# Patient Record
Sex: Female | Born: 1985 | Hispanic: No | Marital: Married | Smoking: Never smoker
Health system: Southern US, Community
[De-identification: ages and names within clinical notes are randomized; demographics above are authoritative.]

## PROBLEM LIST (undated history)

## (undated) DIAGNOSIS — G43909 Migraine, unspecified, not intractable, without status migrainosus: Secondary | ICD-10-CM

---
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
## 2004-10-21 ENCOUNTER — Other Ambulatory Visit: Admission: RE | Admit: 2004-10-21 | Discharge: 2004-10-21 | Payer: Self-pay | Admitting: Gynecology

## 2006-01-20 ENCOUNTER — Other Ambulatory Visit: Admission: RE | Admit: 2006-01-20 | Discharge: 2006-01-20 | Payer: Self-pay | Admitting: Obstetrics and Gynecology

## 2006-06-12 ENCOUNTER — Other Ambulatory Visit: Admission: RE | Admit: 2006-06-12 | Discharge: 2006-06-12 | Payer: Self-pay | Admitting: Gynecology

## 2020-10-10 ENCOUNTER — Encounter: Payer: Self-pay | Admitting: Emergency Medicine

## 2020-10-10 ENCOUNTER — Emergency Department

## 2020-10-10 ENCOUNTER — Emergency Department
Admission: EM | Admit: 2020-10-10 | Discharge: 2020-10-10 | Disposition: A | Discharge status: Home / Self Care | Attending: Emergency Medicine | Admitting: Emergency Medicine

## 2020-10-10 ENCOUNTER — Other Ambulatory Visit: Payer: Self-pay

## 2020-10-10 DIAGNOSIS — Y92009 Unspecified place in unspecified non-institutional (private) residence as the place of occurrence of the external cause: Secondary | ICD-10-CM | POA: Insufficient documentation

## 2020-10-10 DIAGNOSIS — S5011XA Contusion of right forearm, initial encounter: Secondary | ICD-10-CM | POA: Insufficient documentation

## 2020-10-10 DIAGNOSIS — S4991XA Unspecified injury of right shoulder and upper arm, initial encounter: Secondary | ICD-10-CM | POA: Diagnosis present

## 2020-10-10 DIAGNOSIS — S46911A Strain of unspecified muscle, fascia and tendon at shoulder and upper arm level, right arm, initial encounter: Secondary | ICD-10-CM | POA: Insufficient documentation

## 2020-10-10 DIAGNOSIS — W19XXXA Unspecified fall, initial encounter: Secondary | ICD-10-CM | POA: Diagnosis not present

## 2020-10-10 HISTORY — DX: Migraine, unspecified, not intractable, without status migrainosus: G43.909

## 2020-10-10 MED ORDER — IBUPROFEN 800 MG PO TABS
800.0000 mg | ORAL_TABLET | Freq: Once | ORAL | Status: AC
  Administered 2020-10-10: 800 mg via ORAL
  Filled 2020-10-10: qty 1

## 2020-10-10 MED ORDER — MELOXICAM 15 MG PO TABS: 15.0000 mg | ORAL_TABLET | Freq: Every day | ORAL | 0 refills | Status: DC

## 2020-10-10 NOTE — Discharge Instructions (Addendum)
Please wear sling as needed for comfort.  Take meloxicam daily for pain.  You may use Tylenol for additional pain relief.  Avoid taking any other NSAIDs such as Aleve or ibuprofen while you are on the meloxicam.  Apply ice to the wrist and shoulder 20 minutes every hour for the next 2 days.  If any continued pain after 1 week, follow-up with primary care provider.

## 2020-10-10 NOTE — ED Provider Notes (Signed)
[facility] EMERGENCY DEPARTMENT Provider Note   CSN: [id number] Arrival date & time: 10/10/20  1723     History Chief Complaint  Patient presents with  . Arm Injury    Pamela Hester is a 35 y.o. female presents to the emergency department for evaluation of right shoulder and distal forearm/wrist pain.  [place], [place] on her right forearm and developed some shoulder pain.  She has full range of motion of the shoulder but has pain with abduction greater than 90 degrees.  She has good range of motion wrist with no discomfort was tender along the distal forearm.  She has no pain or discomfort along the carpals or navicular region.  No numbness or tingling.  She denies hitting her head or losing consciousness.  No other injuries to her body.  HPI     Past Medical History:  Diagnosis Date  . Migraine     There are no problems to display for this patient.      OB History   No obstetric history on file.     No family history on file.  Social History   Tobacco Use  . Smoking status: Never Smoker  . Smokeless tobacco: Never Used  Vaping Use  . Vaping Use: Never used  Substance Use Topics  . Alcohol use: Yes  . Drug use: Never    Home Medications Prior to Admission medications   Medication Sig Start Date End Date Taking? Authorizing Provider  meloxicam (MOBIC) 15 MG tablet Take 1 tablet (15 mg total) by mouth daily. 10/10/20 10/10/21 Yes Evon Slack, PA-C    Allergies    Patient has no known allergies.  Review of Systems   Review of Systems  Constitutional: Negative for activity change.  Eyes: Negative for pain and visual disturbance.  Respiratory: Negative for shortness of breath.   Cardiovascular: Negative for chest pain and leg swelling.  Gastrointestinal: Negative for abdominal pain.  Genitourinary: Negative for flank pain and pelvic pain.  Musculoskeletal: Positive for arthralgias. Negative for gait  problem, joint swelling, myalgias, neck pain and neck stiffness.  Skin: Negative for wound.  Neurological: Negative for dizziness, syncope, weakness, light-headedness, numbness and headaches.  Psychiatric/Behavioral: Negative for confusion and decreased concentration.    Physical Exam Updated Vital Signs BP (!) 132/91 (BP Location: Left Arm)   Pulse 89   Temp 98.5 F (36.9 C) (Oral)   Resp 20   Ht 5\' 3"  (1.6 m)   Wt 64.4 kg   LMP 09/18/2020   SpO2 100%   BMI 25.15 kg/m   Physical Exam Constitutional:      Appearance: She is well-developed.  HENT:     Head: Normocephalic and atraumatic.     Right Ear: External ear normal.     Left Ear: External ear normal.     Nose: Nose normal.  Eyes:     Conjunctiva/sclera: Conjunctivae normal.     Pupils: Pupils are equal, round, and reactive to light.  Cardiovascular:     Rate and Rhythm: Normal rate.  Pulmonary:     Effort: Pulmonary effort is normal. No respiratory distress.     Breath sounds: Normal breath sounds.  Abdominal:     Palpations: Abdomen is soft.     Tenderness: There is no abdominal tenderness.  Musculoskeletal:        General: Tenderness present. No deformity. Normal range of motion.     Cervical back: Normal range of motion.  Comments: Patient with positive Hawkins and impingement test.  Normal active range of motion of shoulder.  Nontender along the clavicle or cervical spine.  She has full range of motion of the elbow with no tenderness or swelling.  She has some tenderness to the distal forearm at the distal radius metadiaphysis with no wrist joint tenderness and no tenderness along the carpals or navicular bone.  Skin:    General: Skin is warm and dry.     Findings: No rash.  Neurological:     General: No focal deficit present.     Mental Status: She is alert and oriented to person, place, and time. Mental status is at baseline.     Cranial Nerves: No cranial nerve deficit.     Coordination: Coordination  normal.  Psychiatric:        Behavior: Behavior normal.        Thought Content: Thought content normal.     ED Results / Procedures / Treatments   Labs (all labs ordered are listed, but only abnormal results are displayed) Labs Reviewed - No data to display  EKG None  Radiology DG Shoulder Right  Result Date: 10/10/2020 CLINICAL DATA:  Arm injury. EXAM: RIGHT SHOULDER - 2+ VIEW COMPARISON:  None. FINDINGS: There is no evidence of fracture or dislocation. There is no evidence of arthropathy or other focal bone abnormality. Soft tissues are unremarkable. IMPRESSION: Negative. Electronically Signed   By: Ted Mcalpine M.D.   On: 10/10/2020 18:41   DG Wrist Complete Right  Result Date: 10/10/2020 CLINICAL DATA:  Arm injury with pain. EXAM: RIGHT WRIST - COMPLETE 3+ VIEW COMPARISON:  None. FINDINGS: There is no evidence of fracture or dislocation. There is no evidence of arthropathy or other focal bone abnormality. Soft tissues are unremarkable. IMPRESSION: Negative. Electronically Signed   By: Ted Mcalpine M.D.   On: 10/10/2020 18:41    Procedures Procedures   Medications Ordered in ED Medications  ibuprofen (ADVIL) tablet 800 mg (has no administration in time range)    ED Course  I have reviewed the triage vital signs and the nursing notes.  Pertinent labs & imaging results that were available during my care of the patient were reviewed by me and considered in my medical decision making (see chart for details).    MDM Rules/Calculators/A&P                          35 year old female with fall developed right forearm contusion and strain the right shoulder.  X-ray of the shoulder and wrist negative.  She will wear sling for comfort for 1 week.  She will take anti-inflammatory medication, meloxicam for 2 weeks.  She will apply ice over the next couple days and will follow-up with PCP in 1 week if no improvement.  She understands signs symptoms return to the ER  for. Final Clinical Impression(s) / ED Diagnoses Final diagnoses:  Fall, initial encounter  Shoulder strain, right, initial encounter  Contusion of right forearm, initial encounter    Rx / DC Orders ED Discharge Orders         Ordered    meloxicam (MOBIC) 15 MG tablet  Daily        10/10/20 2002           Ronnette Juniper 10/10/20 2008    Minna Antis, MD 10/11/20 0005

## 2020-10-10 NOTE — ED Triage Notes (Signed)
Pt via POV from home. [place] and fell around 4:00pm this evening. Pt c/o R shoulder and R wrist pain. Pt is A&Ox4 and NAD.

## 2021-10-09 ENCOUNTER — Other Ambulatory Visit: Payer: Self-pay

## 2021-10-09 ENCOUNTER — Inpatient Hospital Stay
Admission: EM | Admit: 2021-10-09 | Discharge: 2021-10-10 | DRG: 759 | Disposition: A | Discharge status: Home / Self Care | Attending: Obstetrics and Gynecology | Admitting: Obstetrics and Gynecology

## 2021-10-09 ENCOUNTER — Encounter: Payer: Self-pay | Admitting: Emergency Medicine

## 2021-10-09 ENCOUNTER — Emergency Department

## 2021-10-09 DIAGNOSIS — N730 Acute parametritis and pelvic cellulitis: Principal | ICD-10-CM | POA: Diagnosis present

## 2021-10-09 DIAGNOSIS — N739 Female pelvic inflammatory disease, unspecified: Principal | ICD-10-CM | POA: Diagnosis present

## 2021-10-09 DIAGNOSIS — R102 Pelvic and perineal pain: Secondary | ICD-10-CM | POA: Diagnosis present

## 2021-10-09 LAB — CBC WITH DIFFERENTIAL/PLATELET
Abs Immature Granulocytes: 0.01 10*3/uL (ref 0.00–0.07)
Basophils Absolute: 0 10*3/uL (ref 0.0–0.1)
Basophils Relative: 1 %
Eosinophils Absolute: 0 10*3/uL (ref 0.0–0.5)
Eosinophils Relative: 1 %
HCT: 37.2 % (ref 36.0–46.0)
Hemoglobin: 12 g/dL (ref 12.0–15.0)
Immature Granulocytes: 0 %
Lymphocytes Relative: 35 %
Lymphs Abs: 1.4 10*3/uL (ref 0.7–4.0)
MCH: 29.3 pg (ref 26.0–34.0)
MCHC: 32.3 g/dL (ref 30.0–36.0)
MCV: 90.7 fL (ref 80.0–100.0)
Monocytes Absolute: 0.5 10*3/uL (ref 0.1–1.0)
Monocytes Relative: 13 %
Neutro Abs: 2 10*3/uL (ref 1.7–7.7)
Neutrophils Relative %: 50 %
Platelets: 227 10*3/uL (ref 150–400)
RBC: 4.1 MIL/uL (ref 3.87–5.11)
RDW: 12 % (ref 11.5–15.5)
WBC: 4.1 10*3/uL (ref 4.0–10.5)
nRBC: 0 % (ref 0.0–0.2)

## 2021-10-09 LAB — COMPREHENSIVE METABOLIC PANEL
ALT: 19 U/L (ref 0–44)
AST: 27 U/L (ref 15–41)
Albumin: 4.4 g/dL (ref 3.5–5.0)
Alkaline Phosphatase: 35 U/L — ABNORMAL LOW (ref 38–126)
Anion gap: 8 (ref 5–15)
BUN: 8 mg/dL (ref 6–20)
CO2: 24 mmol/L (ref 22–32)
Calcium: 9.1 mg/dL (ref 8.9–10.3)
Chloride: 108 mmol/L (ref 98–111)
Creatinine, Ser: 0.74 mg/dL (ref 0.44–1.00)
GFR, Estimated: >60 mL/min (ref >60)
Glucose, Bld: 111 mg/dL — ABNORMAL HIGH (ref 70–99)
Potassium: 3.9 mmol/L (ref 3.5–5.1)
Sodium: 140 mmol/L (ref 135–145)
Total Bilirubin: 0.6 mg/dL (ref 0.3–1.2)
Total Protein: 8.1 g/dL (ref 6.5–8.1)

## 2021-10-09 LAB — URINALYSIS, ROUTINE W REFLEX MICROSCOPIC
Bilirubin Urine: NEGATIVE
Glucose, UA: NEGATIVE mg/dL
Hgb urine dipstick: NEGATIVE
Ketones, ur: NEGATIVE mg/dL
Nitrite: NEGATIVE
Protein, ur: NEGATIVE mg/dL
Specific Gravity, Urine: 1.031 — ABNORMAL HIGH (ref 1.005–1.030)
pH: 5 (ref 5.0–8.0)

## 2021-10-09 LAB — PREGNANCY, URINE: Preg Test, Ur: NEGATIVE

## 2021-10-09 LAB — WET PREP, GENITAL
Clue Cells Wet Prep HPF POC: NONE SEEN
Sperm: NONE SEEN
Trich, Wet Prep: NONE SEEN
WBC, Wet Prep HPF POC: >10 — AB (ref <10)
Yeast Wet Prep HPF POC: NONE SEEN

## 2021-10-09 LAB — LACTIC ACID, PLASMA
Lactic Acid, Venous: 2 mmol/L (ref 0.5–1.9)
Lactic Acid, Venous: 0.8 mmol/L (ref 0.5–1.9)

## 2021-10-09 MED ORDER — ACETAMINOPHEN 325 MG PO TABS: 650.0000 mg | ORAL_TABLET | ORAL | Status: DC | PRN

## 2021-10-09 MED ORDER — ONDANSETRON HCL 4 MG PO TABS: 4.0000 mg | ORAL_TABLET | Freq: Four times a day (QID) | ORAL | Status: DC | PRN

## 2021-10-09 MED ORDER — IBUPROFEN 100 MG/5ML PO SUSP
ORAL | Status: AC
  Administered 2021-10-09: 600 mg via ORAL
  Filled 2021-10-09: qty 30

## 2021-10-09 MED ORDER — MORPHINE SULFATE (PF) 4 MG/ML IV SOLN
4.0000 mg | Freq: Once | INTRAVENOUS | Status: AC
  Administered 2021-10-09: 4 mg via INTRAVENOUS
  Filled 2021-10-09: qty 1

## 2021-10-09 MED ORDER — ONDANSETRON HCL 4 MG/2ML IJ SOLN
4.0000 mg | Freq: Four times a day (QID) | INTRAMUSCULAR | Status: DC | PRN
  Administered 2021-10-10: 4 mg via INTRAVENOUS
  Filled 2021-10-09: qty 2

## 2021-10-09 MED ORDER — ACETAMINOPHEN 500 MG PO TABS
1000.0000 mg | ORAL_TABLET | ORAL | Status: AC
  Administered 2021-10-09: 1000 mg via ORAL
  Filled 2021-10-09: qty 2

## 2021-10-09 MED ORDER — IBUPROFEN 100 MG/5ML PO SUSP: 600.0000 mg | Freq: Once | ORAL | Status: AC

## 2021-10-09 MED ORDER — HYDROCODONE-ACETAMINOPHEN 5-325 MG PO TABS
1.0000 | ORAL_TABLET | ORAL | Status: DC | PRN
  Administered 2021-10-10 (×2): 2 via ORAL
  Filled 2021-10-09 (×3): qty 2

## 2021-10-09 MED ORDER — SODIUM CHLORIDE 0.9 % IV SOLN
2.0000 g | Freq: Once | INTRAVENOUS | Status: AC
  Administered 2021-10-09: 2 g via INTRAVENOUS
  Filled 2021-10-09: qty 20

## 2021-10-09 MED ORDER — MORPHINE SULFATE (PF) 2 MG/ML IV SOLN: 1.0000 mg | INTRAVENOUS | Status: DC | PRN

## 2021-10-09 MED ORDER — BISACODYL 10 MG RE SUPP: 10.0000 mg | Freq: Every day | RECTAL | Status: DC | PRN

## 2021-10-09 MED ORDER — PRENATAL MULTIVITAMIN CH: 1.0000 | ORAL_TABLET | Freq: Every day | ORAL | Status: DC

## 2021-10-09 MED ORDER — SODIUM CHLORIDE 0.9 % IV SOLN
1.0000 g | INTRAVENOUS | Status: DC
  Filled 2021-10-09: qty 10

## 2021-10-09 MED ORDER — SODIUM CHLORIDE 0.9 % IV BOLUS
1000.0000 mL | Freq: Once | INTRAVENOUS | Status: AC
  Administered 2021-10-09: 1000 mL via INTRAVENOUS

## 2021-10-09 MED ORDER — SODIUM CHLORIDE 0.9 % IV SOLN: INTRAVENOUS | Status: DC

## 2021-10-09 MED ORDER — SODIUM CHLORIDE 0.9 % IV SOLN
100.0000 mg | Freq: Two times a day (BID) | INTRAVENOUS | Status: DC
  Administered 2021-10-10: 100 mg via INTRAVENOUS
  Filled 2021-10-09 (×2): qty 100

## 2021-10-09 MED ORDER — SODIUM CHLORIDE 0.9 % IV SOLN
100.0000 mg | Freq: Once | INTRAVENOUS | Status: AC
  Administered 2021-10-09: 100 mg via INTRAVENOUS
  Filled 2021-10-09: qty 100

## 2021-10-09 MED ORDER — METRONIDAZOLE 500 MG/100ML IV SOLN
500.0000 mg | Freq: Two times a day (BID) | INTRAVENOUS | Status: DC
  Administered 2021-10-10 (×2): 500 mg via INTRAVENOUS
  Filled 2021-10-09 (×3): qty 100

## 2021-10-09 MED ORDER — SENNA 8.6 MG PO TABS
1.0000 | ORAL_TABLET | Freq: Two times a day (BID) | ORAL | Status: DC
  Filled 2021-10-09 (×2): qty 1

## 2021-10-09 NOTE — H&P (Signed)
Consult History and Physical   SERVICE: Gynecology ***  Patient Name: Pamela Hester Patient MRN:   [id number]  CC: ***  HPI: Pamela Hester is a 36 y.o. No obstetric history on file. with ***   Review of Systems: positives in bold GEN:   fevers, chills, weight changes, appetite changes, fatigue, night sweats HEENT:  HA, vision changes, hearing loss, congestion, rhinorrhea, sinus pressure, dysphagia CV:   CP, palpitations PULM:  SOB, cough GI:  abd pain, N/V/D/C GU:  dysuria, urgency, frequency MSK:  arthralgias, myalgias, back pain, swelling SKIN:  rashes, color changes, pallor NEURO:  numbness, weakness, tingling, seizures, dizziness, tremors PSYCH:  depression, anxiety, behavioral problems, confusion  HEME/LYMPH:  easy bruising or bleeding ENDO:  heat/cold intolerance  Past Obstetrical History: OB History   No obstetric history on file.     Past Gynecologic History: No LMP recorded (lmp unknown). Patient has had an injection. Menstrual frequency Q *** wks lasting *** days requiring *** pads/day,  *** for night time symptoms  Past Medical History: Past Medical History:  Diagnosis Date   Migraine     Past Surgical History:   Past Surgical History:  Procedure Laterality Date   CESAREAN SECTION      Family History:  family history is not on file.  Social History:  Social History   Socioeconomic History   Marital status: Married    Spouse name: Not on file   Number of children: Not on file   Years of education: Not on file   Highest education level: Not on file  Occupational History   Not on file  Tobacco Use   Smoking status: Never   Smokeless tobacco: Never  Vaping Use   Vaping Use: Never used  Substance and Sexual Activity   Alcohol use: Yes   Drug use: Never   Sexual activity: Not on file  Other Topics Concern   Not on file  Social History Narrative   ** Merged History Encounter **       Social Determinants of Health   Financial Resource  Strain: Not on file  Food Insecurity: Not on file  Transportation Needs: Not on file  Physical Activity: Not on file  Stress: Not on file  Social Connections: Not on file  Intimate Partner Violence: Not on file    Home Medications:  Medications reconciled in EPIC  No current facility-administered medications on file prior to encounter.   Current Outpatient Medications on File Prior to Encounter  Medication Sig Dispense Refill   meloxicam (MOBIC) 15 MG tablet Take 1 tablet (15 mg total) by mouth daily. 15 tablet 0    Allergies:  No Known Allergies  Physical Exam:  Temp:  [102.5 F (39.2 C)] 102.5 F (39.2 C) (05/27 2003) Pulse Rate:  [87-102] 87 (05/27 2230) Resp:  [16-20] 16 (05/27 2230) BP: (119-141)/(83-98) 119/83 (05/27 2230) SpO2:  [96 %-100 %] 100 % (05/27 2230) Weight:  [55.8 kg] 55.8 kg (05/27 2004)   General Appearance:  Well developed, well nourished, no acute distress, alert and oriented x3 HEENT:  Normocephalic atraumatic, extraocular movements intact, moist mucous membranes Cardiovascular:  Normal S1/S2, regular rate and rhythm, no murmurs Pulmonary:  clear to auscultation, no wheezes, rales or rhonchi, symmetric air entry, good air exchange Abdomen:  Bowel sounds present, soft, nontender, nondistended, no abnormal masses, no epigastric pain Extremities:  Full range of motion, no pedal edema, 2+ distal pulses, no tenderness Skin:  normal coloration and turgor, no rashes, no suspicious skin lesions  noted  Neurologic:  Cranial nerves 2-12 grossly intact, normal muscle tone, strength 5/5 all four extremities Psychiatric:  Normal mood and affect, appropriate, no AH/VH Pelvic:  NEFG, no vulvar masses or lesions, normal vaginal mucosa, no vaginal bleeding or discharge, cervix without lesions or erythema, ***uterus, no adnexal masses appreciated, *** no palpable nodularity on rectovaginal exam, no pelvic organ prolapse,    Labs/Studies:   CBC and Coags:  Lab  Results  Component Value Date   WBC 4.1 10/09/2021   NEUTOPHILPCT 50 10/09/2021   EOSPCT 1 10/09/2021   BASOPCT 1 10/09/2021   LYMPHOPCT 35 10/09/2021   HGB 12.0 10/09/2021   HCT 37.2 10/09/2021   MCV 90.7 10/09/2021   PLT 227 10/09/2021   CMP:  Lab Results  Component Value Date   NA 140 10/09/2021   K 3.9 10/09/2021   CL 108 10/09/2021   CO2 24 10/09/2021   BUN 8 10/09/2021   CREATININE 0.74 10/09/2021   PROT 8.1 10/09/2021   BILITOT 0.6 10/09/2021   ALT 19 10/09/2021   AST 27 10/09/2021   ALKPHOS 35 (L) 10/09/2021   Other Labs: ***  TVUS:  *** Other Imaging: US PELVIC COMPLETE W TRANSVAGINAL AND TORSION R/O  Result Date: 10/09/2021 CLINICAL DATA:  Pelvic pain EXAM: TRANSABDOMINAL AND TRANSVAGINAL ULTRASOUND OF PELVIS TECHNIQUE: Both transabdominal and transvaginal ultrasound examinations of the pelvis were performed. Transabdominal technique was performed for global imaging of the pelvis including uterus, ovaries, adnexal regions, and pelvic cul-de-sac. It was necessary to proceed with endovaginal exam following the transabdominal exam to visualize the uterus, endometrium, ovaries and adnexa. COMPARISON:  None Available. FINDINGS: Uterus Measurements: 5.7 x 4.3 x 3.6 cm = volume: 46 mL. No fibroids or other mass visualized. Endometrium Thickness: 5 mm in thickness.  No focal abnormality visualized. Right ovary Measurements: 1.9 x 1.2 x 1.2 cm = volume: 1.4 mL. Normal appearance/no adnexal mass. Left ovary Measurements: 2.5 x 1.4 x 1.4 cm = volume: 2.6 mL. Normal appearance/no adnexal mass. Other findings Small amount of free fluid in the left adnexa. IMPRESSION: No acute findings or significant abnormality. Electronically Signed   By: Charlett Nose M.D.   On: 10/09/2021 22:12     Assessment / Plan:   Pamela Hester is a 36 y.o. No obstetric history on file. who presents with ***  1. ***   Thank you for the opportunity to be involved with this pt's care.

## 2021-10-09 NOTE — ED Notes (Signed)
Patient transported to floor at this time.

## 2021-10-09 NOTE — ED Notes (Signed)
Pt instructed to undress and put on gown for [place].

## 2021-10-09 NOTE — ED Notes (Signed)
Report  given to Gypsy Decant, rn

## 2021-10-09 NOTE — ED Triage Notes (Signed)
  Patient comes in with vaginal discharge that has been going on for 2 days.  Patient states the discharge is milky and foul smelling.  Patient endorses dysuria, lower back pain, and is febrile on arrival.  Temp 102.5, patient took 1000 mg of tylenol at 1400.  Patient states she has a hx of bacterial vaginosis, with last infection about a month ago.  Pain 5/10, lower back soreness.

## 2021-10-10 LAB — COMPREHENSIVE METABOLIC PANEL
ALT: 16 U/L (ref 0–44)
AST: 17 U/L (ref 15–41)
Albumin: 3.2 g/dL — ABNORMAL LOW (ref 3.5–5.0)
Alkaline Phosphatase: 28 U/L — ABNORMAL LOW (ref 38–126)
Anion gap: 4 — ABNORMAL LOW (ref 5–15)
BUN: 6 mg/dL (ref 6–20)
CO2: 22 mmol/L (ref 22–32)
Calcium: 7.9 mg/dL — ABNORMAL LOW (ref 8.9–10.3)
Chloride: 114 mmol/L — ABNORMAL HIGH (ref 98–111)
Creatinine, Ser: 0.58 mg/dL (ref 0.44–1.00)
GFR, Estimated: >60 mL/min (ref >60)
Glucose, Bld: 106 mg/dL — ABNORMAL HIGH (ref 70–99)
Potassium: 3.4 mmol/L — ABNORMAL LOW (ref 3.5–5.1)
Sodium: 140 mmol/L (ref 135–145)
Total Bilirubin: 0.3 mg/dL (ref 0.3–1.2)
Total Protein: 6.3 g/dL — ABNORMAL LOW (ref 6.5–8.1)

## 2021-10-10 LAB — CHLAMYDIA/NGC RT PCR (ARMC ONLY)
Chlamydia Tr: NOT DETECTED
N gonorrhoeae: NOT DETECTED

## 2021-10-10 LAB — CBC
HCT: 31.6 % — ABNORMAL LOW (ref 36.0–46.0)
Hemoglobin: 10.1 g/dL — ABNORMAL LOW (ref 12.0–15.0)
MCH: 29.3 pg (ref 26.0–34.0)
MCHC: 32 g/dL (ref 30.0–36.0)
MCV: 91.6 fL (ref 80.0–100.0)
Platelets: 182 10*3/uL (ref 150–400)
RBC: 3.45 MIL/uL — ABNORMAL LOW (ref 3.87–5.11)
RDW: 12.3 % (ref 11.5–15.5)
WBC: 3.3 10*3/uL — ABNORMAL LOW (ref 4.0–10.5)
nRBC: 0 % (ref 0.0–0.2)

## 2021-10-10 LAB — HIV ANTIBODY (ROUTINE TESTING W REFLEX): HIV Screen 4th Generation wRfx: NONREACTIVE

## 2021-10-10 MED ORDER — SENNA 8.6 MG PO TABS
1.0000 | ORAL_TABLET | Freq: Two times a day (BID) | ORAL | 0 refills | Status: AC
Start: 2021-10-10 — End: ?

## 2021-10-10 MED ORDER — DOXYCYCLINE HYCLATE 100 MG PO CAPS: 100.0000 mg | ORAL_CAPSULE | Freq: Two times a day (BID) | ORAL | 0 refills | Status: AC

## 2021-10-10 MED ORDER — ACETAMINOPHEN 160 MG/5ML PO SOLN: 650.0000 mg | Freq: Four times a day (QID) | ORAL | 0 refills | Status: AC | PRN

## 2021-10-10 MED ORDER — METRONIDAZOLE 500 MG PO TABS: 500.0000 mg | ORAL_TABLET | Freq: Two times a day (BID) | ORAL | 0 refills | Status: AC

## 2021-10-10 MED ORDER — IBUPROFEN 100 MG/5ML PO SUSP: 600.0000 mg | Freq: Four times a day (QID) | ORAL | 0 refills | Status: AC | PRN

## 2021-10-10 MED ORDER — ONDANSETRON HCL 4 MG PO TABS: 4.0000 mg | ORAL_TABLET | Freq: Four times a day (QID) | ORAL | 0 refills | Status: AC | PRN

## 2021-10-10 MED ORDER — IBUPROFEN 100 MG/5ML PO SUSP
600.0000 mg | Freq: Four times a day (QID) | ORAL | Status: DC | PRN
  Administered 2021-10-10 (×2): 600 mg via ORAL
  Filled 2021-10-10 (×2): qty 30

## 2021-10-10 MED ORDER — ACETAMINOPHEN 160 MG/5ML PO SOLN
650.0000 mg | Freq: Four times a day (QID) | ORAL | Status: DC | PRN
  Administered 2021-10-10: 650 mg via ORAL
  Filled 2021-10-10 (×2): qty 20.3

## 2021-10-10 MED ORDER — IBUPROFEN 100 MG/5ML PO SUSP: 600.0000 mg | Freq: Four times a day (QID) | ORAL | Status: DC | PRN

## 2021-10-10 NOTE — ED Provider Notes (Signed)
[facility] Provider Note    Event Date/Time   First MD Initiated Contact with Patient 10/09/21 2240     (approximate)   History   Vaginal Discharge   HPI  Pamela Hester is a 36 y.o. female reports history of previous bacterial vaginosis.  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  Yesterday started noticing vaginal discharge, thick and white.  Today she started developing pain in her lower pelvis vaginal area and ongoing discharge.  She reports increasing pain in the area and developed fevers and chills as well.  Denies abdominal pain.  No runny nose no cough no headache no chest pain or trouble breathing.  Sexually active with 1 partner.  Previous surgical history includes breast lift and "tummy tuck" but no history obstetrical or intra-abdominal surgeries  Presently reports moderate to severe pain in her lower pelvis and thick vaginal discharge.     Physical Exam   Triage Vital Signs: ED Triage Vitals  Enc Vitals Group     BP 10/09/21 2003 (!) 141/98     Pulse Rate 10/09/21 2003 (!) 102     Resp 10/09/21 2003 20     Temp 10/09/21 2003 (!) 102.5 F (39.2 C)     Temp Source 10/09/21 2003 Oral     SpO2 10/09/21 2003 96 %     Weight 10/09/21 2004 123 lb (55.8 kg)     Height 10/09/21 2004 5\' 3"  (1.6 m)     Head Circumference --      Peak Flow --      Pain Score 10/09/21 2003 5     Pain Loc --      Pain Edu? --      Excl. in GC? --     Most recent vital signs: Vitals:   10/09/21 2312 10/09/21 2350  BP: (!) 125/92 117/84  Pulse: 83 77  Resp: 16 17  Temp: 98.9 F (37.2 C) 99 F (37.2 C)  SpO2: 100% 100%     General: Awake, no distress.  Appears in pain reports pain in her lower pelvis CV:  Good peripheral perfusion.  Slight tachycardia Resp:  Normal effort.  Normal effort clear lungs Abd:  No distention.  No abdominal pain to examination, reports some mild discomfort over the suprapubic region.  No pain in  McBurney's point.  Negative Brossman.  No peritonitis.  Negative Murphy. Other:  Pelvic exam escorted by bedside nurse.  Patient has a thick purulent discharge coming from the os, she also has on digital examination severe tenderness with cervical motion testing.   ED Results / Procedures / Treatments   Labs (all labs ordered are listed, but only abnormal results are displayed) Labs Reviewed  WET PREP, GENITAL - Abnormal; Notable for the following components:      Result Value   WBC, Wet Prep HPF POC >10 (*)    All other components within normal limits  URINALYSIS, ROUTINE W REFLEX MICROSCOPIC - Abnormal; Notable for the following components:   Color, Urine YELLOW (*)    APPearance HAZY (*)    Specific Gravity, Urine 1.031 (*)    Leukocytes,Ua TRACE (*)    Bacteria, UA RARE (*)    All other components within normal limits  COMPREHENSIVE METABOLIC PANEL - Abnormal; Notable for the following components:   Glucose, Bld 111 (*)    Alkaline Phosphatase 35 (*)    All other components within normal limits  LACTIC ACID, PLASMA -  Abnormal; Notable for the following components:   Lactic Acid, Venous 2.0 (*)    All other components within normal limits  CHLAMYDIA/NGC RT PCR ([facility] ONLY)            URINE CULTURE  CULTURE, BLOOD (ROUTINE X 2)  CULTURE, BLOOD (ROUTINE X 2)  PREGNANCY, URINE  CBC WITH DIFFERENTIAL/PLATELET  LACTIC ACID, PLASMA  HIV ANTIBODY (ROUTINE TESTING W REFLEX)  CBC  COMPREHENSIVE METABOLIC PANEL     EKG     RADIOLOGY     US PELVIC COMPLETE W TRANSVAGINAL AND TORSION R/O  Result Date: 10/09/2021 CLINICAL DATA:  Pelvic pain EXAM: TRANSABDOMINAL AND TRANSVAGINAL ULTRASOUND OF PELVIS TECHNIQUE: Both transabdominal and transvaginal ultrasound examinations of the pelvis were performed. Transabdominal technique was performed for global imaging of the pelvis including uterus, ovaries, adnexal regions, and pelvic cul-de-sac. It was necessary to proceed with  endovaginal exam following the transabdominal exam to visualize the uterus, endometrium, ovaries and adnexa. COMPARISON:  None Available. FINDINGS: Uterus Measurements: 5.7 x 4.3 x 3.6 cm = volume: 46 mL. No fibroids or other mass visualized. Endometrium Thickness: 5 mm in thickness.  No focal abnormality visualized. Right ovary Measurements: 1.9 x 1.2 x 1.2 cm = volume: 1.4 mL. Normal appearance/no adnexal mass. Left ovary Measurements: 2.5 x 1.4 x 1.4 cm = volume: 2.6 mL. Normal appearance/no adnexal mass. Other findings Small amount of free fluid in the left adnexa. IMPRESSION: No acute findings or significant abnormality. Electronically Signed   By: Charlett Nose M.D.   On: 10/09/2021 22:12     Imaging transvaginal ultrasound negative for acute finding   PROCEDURES:  Critical Care performed: No  Procedures   MEDICATIONS ORDERED IN ED: Medications  doxycycline (VIBRAMYCIN) 100 mg in sodium chloride 0.9 % 250 mL IVPB (100 mg Intravenous New Bag/Given 10/09/21 2323)  prenatal multivitamin tablet 1 tablet (has no administration in time range)  0.9 %  sodium chloride infusion (has no administration in time range)  cefTRIAXone (ROCEPHIN) 1 g in sodium chloride 0.9 % 100 mL IVPB (has no administration in time range)  doxycycline (VIBRAMYCIN) 100 mg in sodium chloride 0.9 % 250 mL IVPB (has no administration in time range)  metroNIDAZOLE (FLAGYL) IVPB 500 mg (has no administration in time range)  HYDROcodone-acetaminophen (NORCO/VICODIN) 5-325 MG per tablet 1-2 tablet (2 tablets Oral Given 10/10/21 0012)  senna (SENOKOT) tablet 8.6 mg (8.6 mg Oral Not Given 10/09/21 2323)  ondansetron (ZOFRAN) tablet 4 mg (has no administration in time range)    Or  ondansetron (ZOFRAN) injection 4 mg (has no administration in time range)  bisacodyl (DULCOLAX) suppository 10 mg (has no administration in time range)  morphine (PF) 2 MG/ML injection 1-2 mg (has no administration in time range)  ibuprofen (ADVIL)  100 MG/5ML suspension 600 mg (has no administration in time range)  acetaminophen (TYLENOL) 160 MG/5ML solution 650 mg (has no administration in time range)  ibuprofen (ADVIL) 100 MG/5ML suspension 600 mg (600 mg Oral Given 10/09/21 2012)  sodium chloride 0.9 % bolus 1,000 mL (0 mLs Intravenous Stopped 10/09/21 2313)  acetaminophen (TYLENOL) tablet 1,000 mg (1,000 mg Oral Given 10/09/21 2132)  cefTRIAXone (ROCEPHIN) 2 g in sodium chloride 0.9 % 100 mL IVPB (0 g Intravenous Stopped 10/09/21 2313)  morphine (PF) 4 MG/ML injection 4 mg (4 mg Intravenous Given 10/09/21 2225)     IMPRESSION / MDM / ASSESSMENT AND PLAN / ED COURSE  I reviewed the triage vital signs and the nursing notes.  Differential diagnosis includes, but is not limited to, PID, pregnancy, ectopic pregnancy, tubo-ovarian abscess, referred pain from intra-abdominal infection urinary tract infection etc.  Given the patient's presentation fever tachycardia and clinical examination her exam with significant cervical motion tenderness and heavy discharge seems consistent with pelvic inflammatory disease.  Swab sent for gonorrhea chlamydia and also wet prep.  Will initiate on doxycycline and ceftriaxone.  She presents with fever, tachycardia, severe pain discussed with Dr. Dalbert GarnetBeasley of OB/GYN service.  Patient will be admitted and further evaluated under gynecology service  Low probability or concern for acute intra-abdominal infection such as appendicitis diverticulitis abdominal perforation colitis etc., her symptoms seem to be very gynecologic in nature.  Reassuring ultrasound.  On abdominal exam very reassuring, no pain in McBurney's point.   Patient's presentation is most consistent with acute presentation with potential threat to life or bodily function.  The patient is on the cardiac monitor to evaluate for evidence of arrhythmia and/or significant heart rate changes.  Pain notably improved with ibuprofen  and Motrin, also improved with morphine.  Patient understand agreeable with plan for admission, will be admitted to GYN service.     FINAL CLINICAL IMPRESSION(S) / ED DIAGNOSES   Final diagnoses:  PID (acute pelvic inflammatory disease)  Vaginal discharge, vaginal pain   Rx / DC Orders   ED Discharge Orders     None        Note:  This document was prepared using Dragon voice recognition software and may include unintentional dictation errors.   Sharyn CreamerQuale, Kyran Whittier, MD 10/10/21 [phone]

## 2021-10-10 NOTE — Discharge Summary (Signed)
GynecologicalDischarge Summary  Patient Name: Pamela Hester DOB: [birth year] MRN: [id number]  Date of Admission: 10/09/2021 Date of Discharge: 10/10/2021  Hospital course:   The patient was admitted from the ER on 10/09/21 with fever, pelvic pain and purulent vaginal discharge. Initial Lactic acid was elevated, but subsequent repeat was normal. Blood culture preliminary was negative, STI screen and Wet prep WNL. Pelvic US benign.  - consulted with Dr Dalbert Garnet, IV Abx Rocephin, Doxycycline and Flagyl ordered with pain medications.  - after 24hours afebrile and IV Abx given, pt was having no pain and deemed stable for DC home.   Discharge Physical Exam:  BP 114/77 (BP Location: Right Arm)   Pulse 78   Temp 99.1 F (37.3 C) (Oral)   Resp 18   Ht 5\' 3"  (1.6 m)   Wt 55.8 kg   LMP  (LMP Unknown)   SpO2 100%   BMI 21.79 kg/m   General: NAD CV: RRR Pulm: CTABL, nl effort ABD: s/nd/nt DVT Evaluation: LE non-ttp, no evidence of DVT on exam.  Hemoglobin  Date Value Ref Range Status  10/10/2021 10.1 (L) 12.0 - 15.0 g/dL Final   HCT  Date Value Ref Range Status  10/10/2021 31.6 (L) 36.0 - 46.0 % Final      Plan:  10/12/2021 was discharged to home in good condition. Follow-up appointment at [facility] in 2 weeks   Discharge Medications: Allergies as of 10/10/2021   No Known Allergies      Medication List     STOP taking these medications    meloxicam 15 MG tablet Commonly known as: MOBIC       TAKE these medications    acetaminophen 160 MG/5ML solution Commonly known as: TYLENOL Take 20.3 mLs (650 mg total) by mouth every 6 (six) hours as needed for mild pain or fever.   doxycycline 100 MG capsule Commonly known as: VIBRAMYCIN Take 1 capsule (100 mg total) by mouth 2 (two) times daily for 14 days.   ibuprofen 100 MG/5ML suspension Commonly known as: ADVIL Take 30 mLs (600 mg total) by mouth every 6 (six) hours as needed for up to 10 doses for  mild pain.   metroNIDAZOLE 500 MG tablet Commonly known as: Flagyl Take 1 tablet (500 mg total) by mouth 2 (two) times daily for 14 days.   ondansetron 4 MG tablet Commonly known as: ZOFRAN Take 1 tablet (4 mg total) by mouth every 6 (six) hours as needed for nausea.   senna 8.6 MG Tabs tablet Commonly known as: SENOKOT Take 1 tablet (8.6 mg total) by mouth 2 (two) times daily.         Follow-up Information     [facility]. Schedule an appointment as soon as possible for a visit in 2 week(s).   Why: hospital followup- May be seen by primary Gyn provider per preference. Contact information: [address] [place] [phone]                Signed: [phone], CNM 8:50 PM

## 2021-10-10 NOTE — Progress Notes (Signed)
Pt discharged home. All DC inst given, work note given per pt request. #20 in LAC removed. Catheter intact. Pressure applied and no bleeding noted. No questions or concerns expressed. DC home with family member.

## 2021-10-10 NOTE — Progress Notes (Signed)
Report received from April Brumgard, RN at 23:05. Patient arrived to unit at 23:45. Patient comfortable. Plan of care discussed with patient. Patient pain 5/10. RN to give PRN Norco. No vaginal drainage seen currently but patient states drainage is still constant. VSS. RN will continue to monitor.

## 2021-10-10 NOTE — Progress Notes (Signed)
Obstetric and Gynecology  POD/HD # 1  Subjective  Patient doing well, no complaints, tolerating PO intake, tolerating pain with PO meds, ambulating without difficulty, voiding spontaneously.     Denies CP, SOB, F/C, N/V/D, or leg pain.   Objective   Objective:   Vitals:   10/09/21 2350 10/10/21 0107 10/10/21 0350 10/10/21 0819  BP: 117/84 117/77 109/75 104/82  Pulse: 77 73 74   Resp: 17 18 17 18   Temp: 99 F (37.2 C) 98.7 F (37.1 C) 98.7 F (37.1 C) 98.6 F (37 C)  TempSrc: Oral Oral Oral Oral  SpO2: 100% 100% 100% 99%  Weight:      Height:       Temp:  [98.6 F (37 C)-102.5 F (39.2 C)] 98.6 F (37 C) (05/28 0819) Pulse Rate:  [73-102] 74 (05/28 0350) Resp:  [16-20] 18 (05/28 0819) BP: (104-141)/(75-98) 104/82 (05/28 0819) SpO2:  [96 %-100 %] 99 % (05/28 0819) Weight:  [55.8 kg] 55.8 kg (05/27 2004) I/O last 3 completed shifts: In: 1450 [IV Piggyback:1450] Out: 200 [Urine:200] Total I/O In: -  Out: 200 [Urine:200]  Intake/Output Summary (Last 24 hours) at 10/10/2021 1146 Last data filed at 10/10/2021 0936 Gross per 24 hour  Intake 1450 ml  Output 400 ml  Net 1050 ml     Current Vital Signs 24h Vital Sign Ranges  T 98.6 F (37 C) Temp  Avg: 99.4 F (37.4 C)  Min: 98.6 F (37 C)  Max: 102.5 F (39.2 C)  BP 104/82 BP  Min: 104/82  Max: 141/98  HR 74 Pulse  Avg: 82.7  Min: 73  Max: 102  RR 18 Resp  Avg: 17.4  Min: 16  Max: 20  SaO2 99 % Room Air SpO2  Avg: 99.3 %  Min: 96 %  Max: 100 %           24 Hour I/O Current Shift I/O  Time Ins Outs 05/27 0701 - 05/28 0700 In: 1450  Out: 200 [Urine:200] 05/28 0701 - 05/28 1900 In: -  Out: 200 [Urine:200]   General: NAD Cardiovascular: RRR, no murmurs Pulmonary: CTAB, normal respiratory effort Abdomen: Benign. Non-tender, +BS, no guarding.  Extremities: No erythema or cords, no calf tenderness, with normal peripheral pulses.  Labs: Results for orders placed or performed during the hospital  encounter of 10/09/21 (from the past 24 hour(s))  Chlamydia/NGC rt PCR ([facility] only)     Status: None   Collection Time: 10/09/21  7:12 PM   Specimen: Cervical/Vaginal swab; Genital  Result Value Ref Range   Specimen source GC/Chlam URINE, RANDOM    Chlamydia Tr NOT DETECTED NOT DETECTED   N gonorrhoeae NOT DETECTED NOT DETECTED  Pregnancy, urine     Status: None   Collection Time: 10/09/21  8:08 PM  Result Value Ref Range   Preg Test, Ur NEGATIVE NEGATIVE  Urinalysis, Routine w reflex microscopic Urine, Clean Catch     Status: Abnormal   Collection Time: 10/09/21  8:08 PM  Result Value Ref Range   Color, Urine YELLOW (A) YELLOW   APPearance HAZY (A) CLEAR   Specific Gravity, Urine 1.031 (H) 1.005 - 1.030   pH 5.0 5.0 - 8.0   Glucose, UA NEGATIVE NEGATIVE mg/dL   Hgb urine dipstick NEGATIVE NEGATIVE   Bilirubin Urine NEGATIVE NEGATIVE   Ketones, ur NEGATIVE NEGATIVE mg/dL   Protein, ur NEGATIVE NEGATIVE mg/dL   Nitrite NEGATIVE NEGATIVE   Leukocytes,Ua TRACE (A) NEGATIVE   RBC / HPF 0-5  0 - 5 RBC/hpf   WBC, UA 0-5 0 - 5 WBC/hpf   Bacteria, UA RARE (A) NONE SEEN   Squamous Epithelial / LPF 0-5 0 - 5   Mucus PRESENT    Hyaline Casts, UA PRESENT    Ca Oxalate Crys, UA PRESENT   CBC with Differential     Status: None   Collection Time: 10/09/21  8:24 PM  Result Value Ref Range   WBC 4.1 4.0 - 10.5 K/uL   RBC 4.10 3.87 - 5.11 MIL/uL   Hemoglobin 12.0 12.0 - 15.0 g/dL   HCT 16.137.2 09.636.0 - 04.546.0 %   MCV 90.7 80.0 - 100.0 fL   MCH 29.3 26.0 - 34.0 pg   MCHC 32.3 30.0 - 36.0 g/dL   RDW 40.912.0 81.111.5 - 91.415.5 %   Platelets 227 150 - 400 K/uL   nRBC 0.0 0.0 - 0.2 %   Neutrophils Relative % 50 %   Neutro Abs 2.0 1.7 - 7.7 K/uL   Lymphocytes Relative 35 %   Lymphs Abs 1.4 0.7 - 4.0 K/uL   Monocytes Relative 13 %   Monocytes Absolute 0.5 0.1 - 1.0 K/uL   Eosinophils Relative 1 %   Eosinophils Absolute 0.0 0.0 - 0.5 K/uL   Basophils Relative 1 %   Basophils Absolute 0.0 0.0 - 0.1 K/uL    Immature Granulocytes 0 %   Abs Immature Granulocytes 0.01 0.00 - 0.07 K/uL  Comprehensive metabolic panel     Status: Abnormal   Collection Time: 10/09/21  8:24 PM  Result Value Ref Range   Sodium 140 135 - 145 mmol/L   Potassium 3.9 3.5 - 5.1 mmol/L   Chloride 108 98 - 111 mmol/L   CO2 24 22 - 32 mmol/L   Glucose, Bld 111 (H) 70 - 99 mg/dL   BUN 8 6 - 20 mg/dL   Creatinine, Ser 7.820.74 0.44 - 1.00 mg/dL   Calcium 9.1 8.9 - 95.610.3 mg/dL   Total Protein 8.1 6.5 - 8.1 g/dL   Albumin 4.4 3.5 - 5.0 g/dL   AST 27 15 - 41 U/L   ALT 19 0 - 44 U/L   Alkaline Phosphatase 35 (L) 38 - 126 U/L   Total Bilirubin 0.6 0.3 - 1.2 mg/dL   GFR, Estimated >21>60 >30>60 mL/min   Anion gap 8 5 - 15  Lactic acid, plasma     Status: Abnormal   Collection Time: 10/09/21  8:24 PM  Result Value Ref Range   Lactic Acid, Venous 2.0 (HH) 0.5 - 1.9 mmol/L  Lactic acid, plasma     Status: None   Collection Time: 10/09/21 10:16 PM  Result Value Ref Range   Lactic Acid, Venous 0.8 0.5 - 1.9 mmol/L  Wet prep, genital     Status: Abnormal   Collection Time: 10/09/21 10:19 PM   Specimen: Cervical/Vaginal swab  Result Value Ref Range   Yeast Wet Prep HPF POC NONE SEEN NONE SEEN   Trich, Wet Prep NONE SEEN NONE SEEN   Clue Cells Wet Prep HPF POC NONE SEEN NONE SEEN   WBC, Wet Prep HPF POC >10 (A) <10   Sperm NONE SEEN   Blood culture (routine x 2)     Status: None (Preliminary result)   Collection Time: 10/09/21 10:21 PM   Specimen: BLOOD  Result Value Ref Range   Specimen Description BLOOD RIGHT ANTECUBITAL    Special Requests      BOTTLES DRAWN AEROBIC AND ANAEROBIC Blood Culture adequate  volume   Culture      NO GROWTH < 12 HOURS Performed at [facility], [address] [place], [place] [address]    Report Status PENDING   HIV Antibody (routine testing w rflx)     Status: None   Collection Time: 10/10/21 12:09 AM  Result Value Ref Range   HIV Screen 4th Generation wRfx Non Reactive Non Reactive   Blood culture (routine x 2)     Status: None (Preliminary result)   Collection Time: 10/10/21 12:09 AM   Specimen: BLOOD  Result Value Ref Range   Specimen Description BLOOD RIGHT ANTECUBITAL    Special Requests      BOTTLES DRAWN AEROBIC AND ANAEROBIC Blood Culture adequate volume   Culture      NO GROWTH < 12 HOURS Performed at [facility], [address] [place], [place] [address]    Report Status PENDING   CBC     Status: Abnormal   Collection Time: 10/10/21  5:33 AM  Result Value Ref Range   WBC 3.3 (L) 4.0 - 10.5 K/uL   RBC 3.45 (L) 3.87 - 5.11 MIL/uL   Hemoglobin 10.1 (L) 12.0 - 15.0 g/dL   HCT 84.1 (L) 32.4 - 40.1 %   MCV 91.6 80.0 - 100.0 fL   MCH 29.3 26.0 - 34.0 pg   MCHC 32.0 30.0 - 36.0 g/dL   RDW 02.7 25.3 - 66.4 %   Platelets 182 150 - 400 K/uL   nRBC 0.0 0.0 - 0.2 %  Comprehensive metabolic panel     Status: Abnormal   Collection Time: 10/10/21  5:33 AM  Result Value Ref Range   Sodium 140 135 - 145 mmol/L   Potassium 3.4 (L) 3.5 - 5.1 mmol/L   Chloride 114 (H) 98 - 111 mmol/L   CO2 22 22 - 32 mmol/L   Glucose, Bld 106 (H) 70 - 99 mg/dL   BUN 6 6 - 20 mg/dL   Creatinine, Ser 4.03 0.44 - 1.00 mg/dL   Calcium 7.9 (L) 8.9 - 10.3 mg/dL   Total Protein 6.3 (L) 6.5 - 8.1 g/dL   Albumin 3.2 (L) 3.5 - 5.0 g/dL   AST 17 15 - 41 U/L   ALT 16 0 - 44 U/L   Alkaline Phosphatase 28 (L) 38 - 126 U/L   Total Bilirubin 0.3 0.3 - 1.2 mg/dL   GFR, Estimated >47 >42 mL/min   Anion gap 4 (L) 5 - 15    Cultures: Results for orders placed or performed during the hospital encounter of 10/09/21  Chlamydia/NGC rt PCR ([facility] only)     Status: None   Collection Time: 10/09/21  7:12 PM   Specimen: Cervical/Vaginal swab; Genital  Result Value Ref Range Status   Specimen source GC/Chlam URINE, RANDOM  Final   Chlamydia Tr NOT DETECTED NOT DETECTED Final   N gonorrhoeae NOT DETECTED NOT DETECTED Final    Comment: (NOTE) This CT/NG assay has not been evaluated in  patients with a history of  hysterectomy. Performed at [facility], [address] [place], [place] [address]   Wet prep, genital     Status: Abnormal   Collection Time: 10/09/21 10:19 PM   Specimen: Cervical/Vaginal swab  Result Value Ref Range Status   Yeast Wet Prep HPF POC NONE SEEN NONE SEEN Final   Trich, Wet Prep NONE SEEN NONE SEEN Final   Clue Cells Wet Prep HPF POC NONE SEEN NONE SEEN Final  WBC, Wet Prep HPF POC >10 (A) <10 Final   Sperm NONE SEEN  Final    Comment: Performed at [facility], [address] [place], [place] [address]  Blood culture (routine x 2)     Status: None (Preliminary result)   Collection Time: 10/09/21 10:21 PM   Specimen: BLOOD  Result Value Ref Range Status   Specimen Description BLOOD RIGHT ANTECUBITAL  Final   Special Requests   Final    BOTTLES DRAWN AEROBIC AND ANAEROBIC Blood Culture adequate volume   Culture   Final    NO GROWTH < 12 HOURS Performed at [facility], [address], [place], [place] [address]    Report Status PENDING  Incomplete  Blood culture (routine x 2)     Status: None (Preliminary result)   Collection Time: 10/10/21 12:09 AM   Specimen: BLOOD  Result Value Ref Range Status   Specimen Description BLOOD RIGHT ANTECUBITAL  Final   Special Requests   Final    BOTTLES DRAWN AEROBIC AND ANAEROBIC Blood Culture adequate volume   Culture   Final    NO GROWTH < 12 HOURS Performed at [facility], [address] [place], [place] [address]    Report Status PENDING  Incomplete    Imaging: US PELVIC COMPLETE W TRANSVAGINAL AND TORSION R/O  Result Date: 10/09/2021 CLINICAL DATA:  Pelvic pain EXAM: TRANSABDOMINAL AND TRANSVAGINAL ULTRASOUND OF PELVIS TECHNIQUE: Both transabdominal and transvaginal ultrasound examinations of the pelvis were performed. Transabdominal technique was performed for global imaging of the pelvis including uterus, ovaries, adnexal regions, and pelvic  cul-de-sac. It was necessary to proceed with endovaginal exam following the transabdominal exam to visualize the uterus, endometrium, ovaries and adnexa. COMPARISON:  None Available. FINDINGS: Uterus Measurements: 5.7 x 4.3 x 3.6 cm = volume: 46 mL. No fibroids or other mass visualized. Endometrium Thickness: 5 mm in thickness.  No focal abnormality visualized. Right ovary Measurements: 1.9 x 1.2 x 1.2 cm = volume: 1.4 mL. Normal appearance/no adnexal mass. Left ovary Measurements: 2.5 x 1.4 x 1.4 cm = volume: 2.6 mL. Normal appearance/no adnexal mass. Other findings Small amount of free fluid in the left adnexa. IMPRESSION: No acute findings or significant abnormality. Electronically Signed   By: Charlett Nose M.D.   On: 10/09/2021 22:12     Assessment   36 y.o. G1P1 with acute PID Hospital Day: 2   Plan   1. Afebrile since 8pm yesterday, pt reports resolved pain. Normal WBC, neg preliminary blood cx, Neg wet prep and GC/CT.  2. D/w BEB, pt may DC home at 24hrs afebrile and after 24hours of IV Abx this evening if pain/fever remains absent. DC home on Doxy and Flagyl x 14d.

## 2021-10-11 LAB — URINE CULTURE: Culture: <10000 — AB

## 2021-10-14 LAB — CULTURE, BLOOD (ROUTINE X 2)
Culture: NO GROWTH
Special Requests: ADEQUATE

## 2021-10-15 LAB — CULTURE, BLOOD (ROUTINE X 2)
Culture: NO GROWTH
Special Requests: ADEQUATE

## 2022-06-16 IMAGING — US US PELVIS COMPLETE TRANSABD/TRANSVAG W DUPLEX AND/OR DOPPLER
1 series · 14 of 25 positions shown · non-contrast
Comparison: None Available.

CLINICAL DATA: Pelvic pain



[Series 1: us pelvic complete w transvaginal and torsion righ · 14 of 90 slices shown]
[im 1/90]
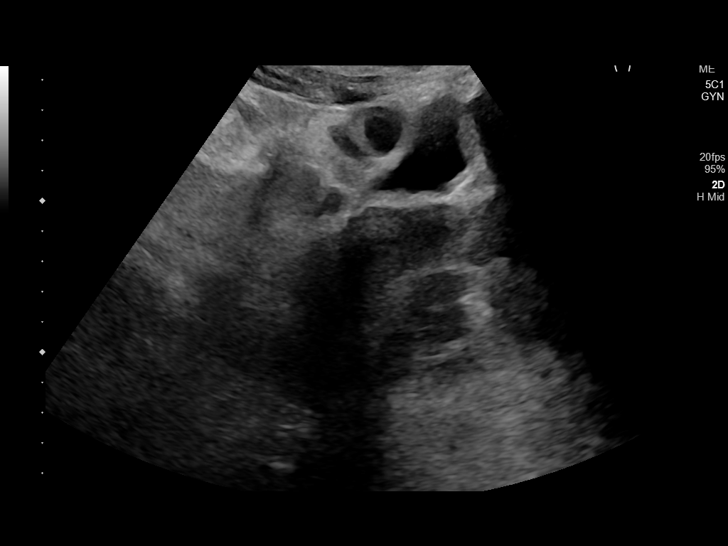
[im 8/90]
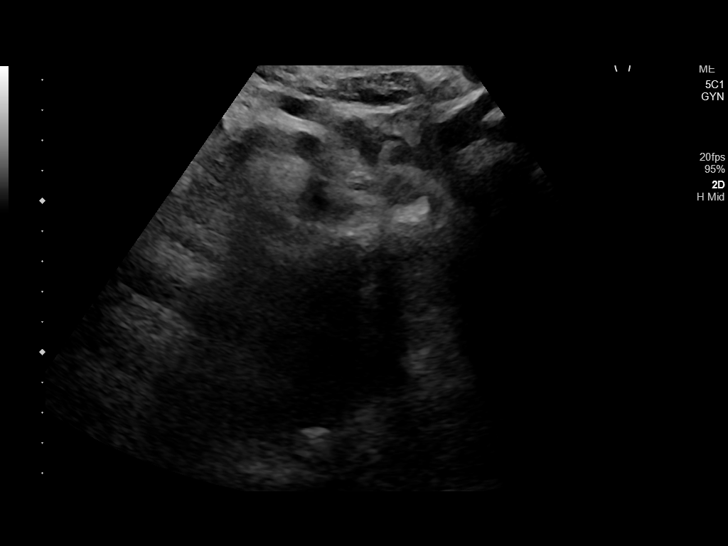
[im 15/90]
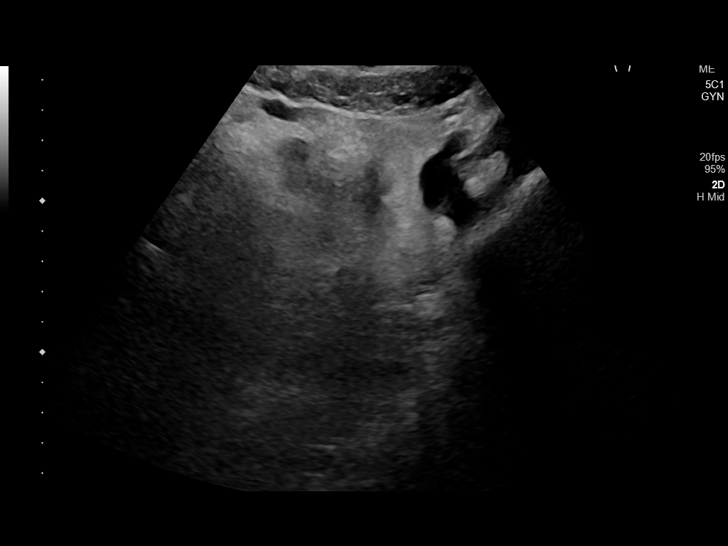
[im 23/90]
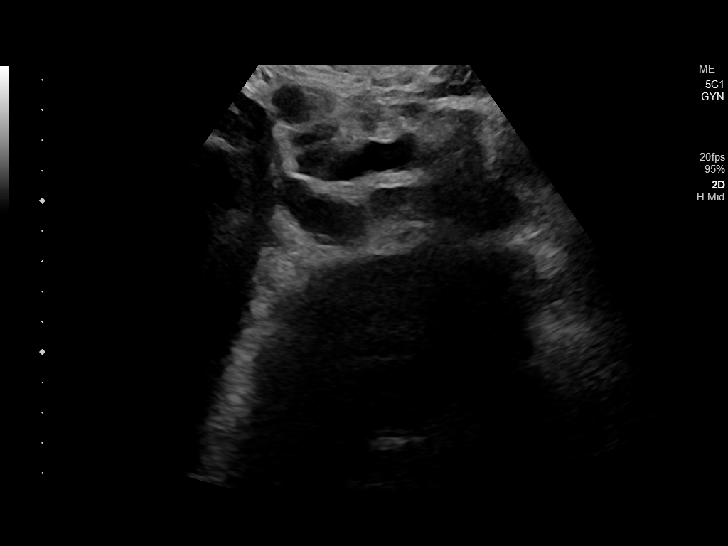
[im 30/90]
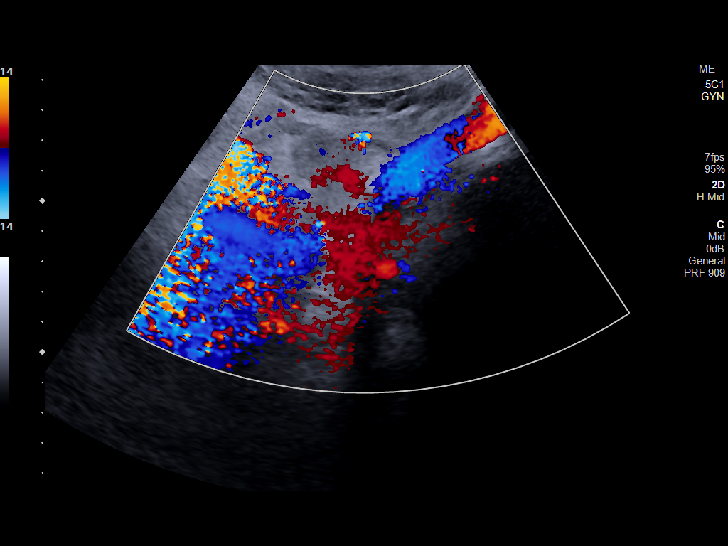
[im 34/90]
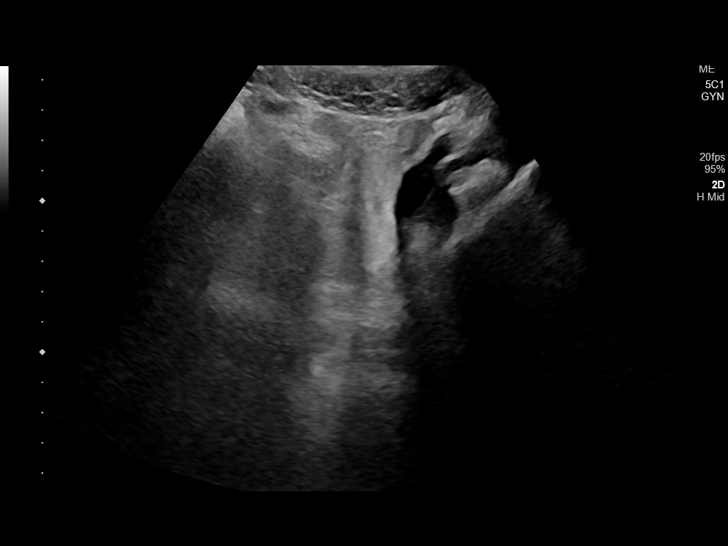
[im 41/90]
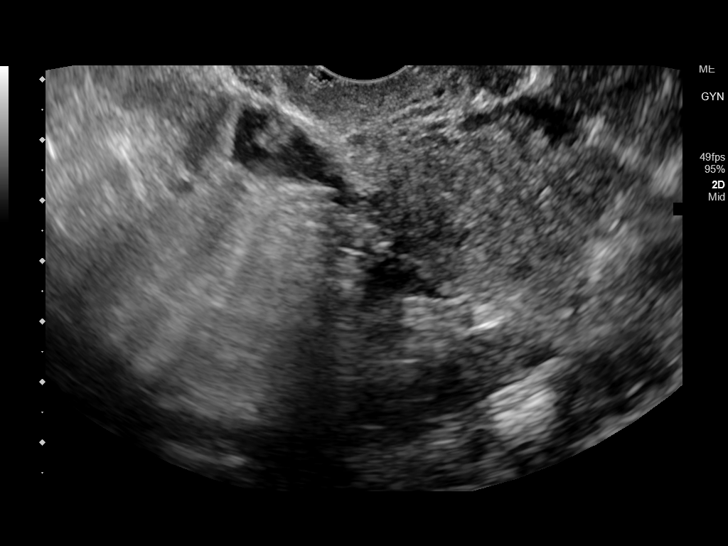
[im 49/90]
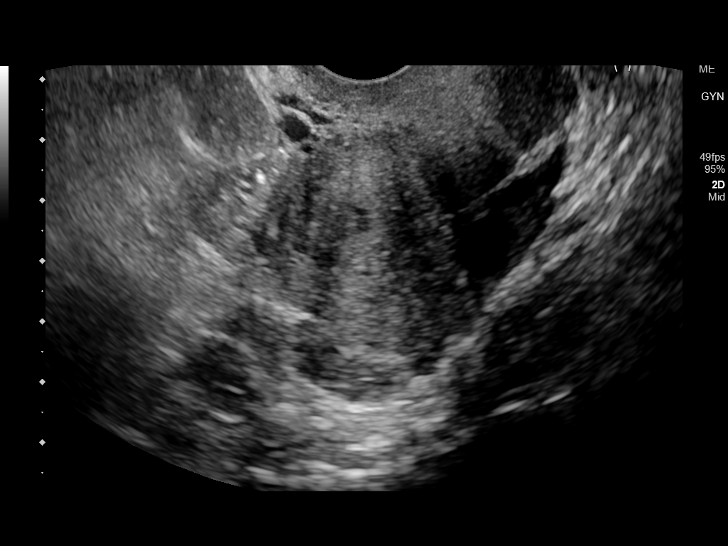
[im 56/90]
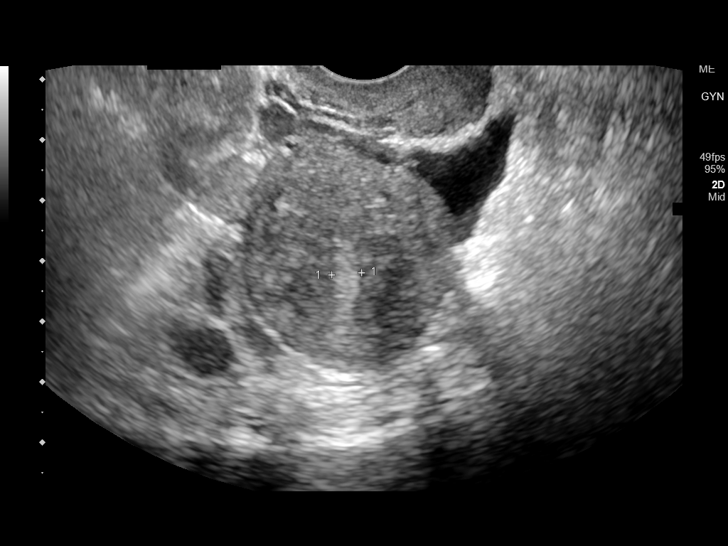
[im 60/90]
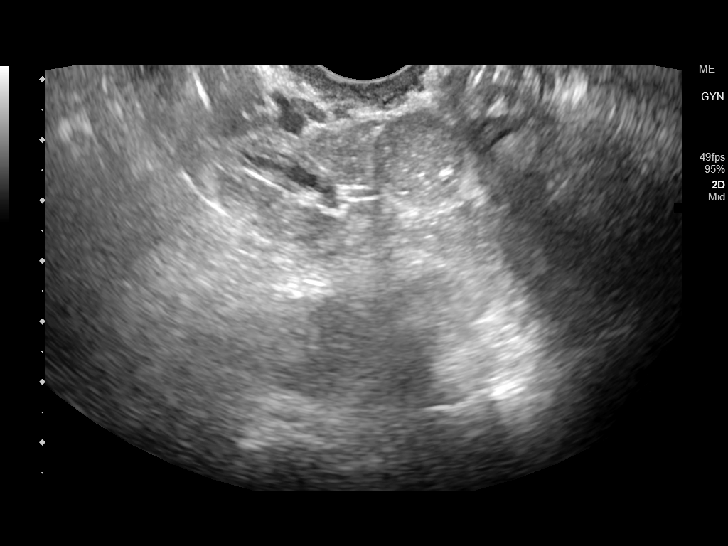
[im 67/90]
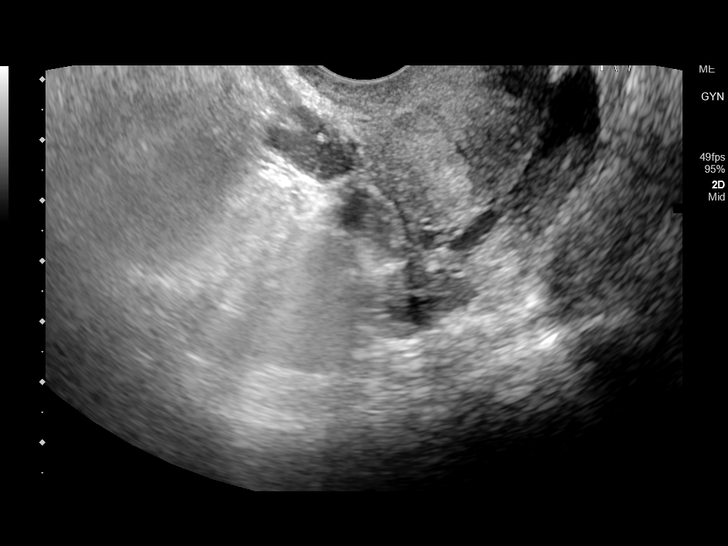
[im 75/90]
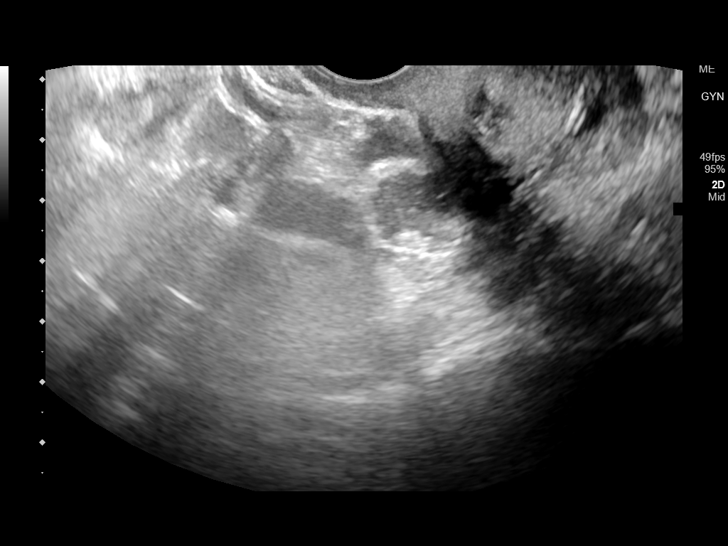
[im 82/90]
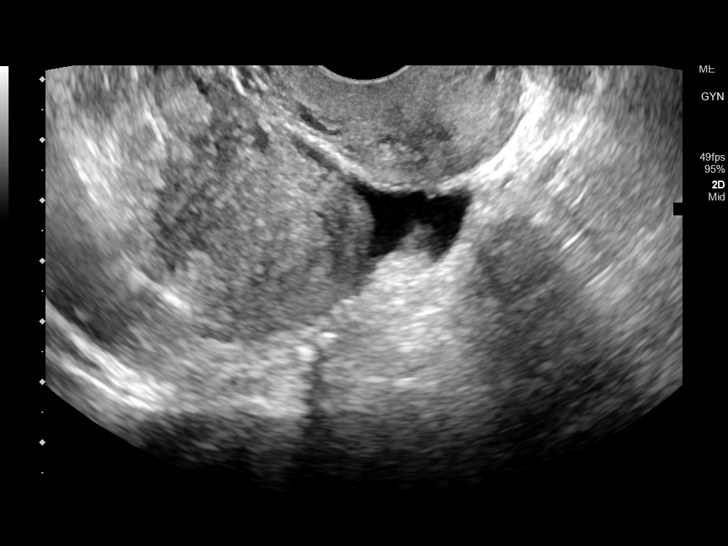
[im 90/90]
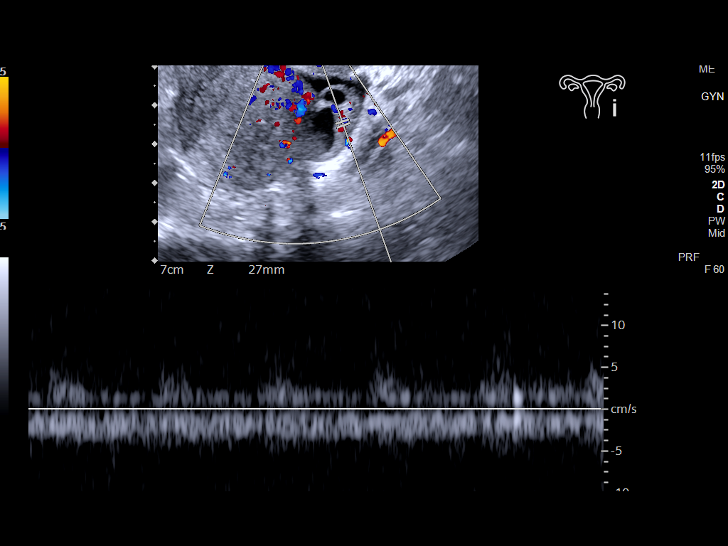

[14 of 25 positions shown; findings below may reference images not displayed]

FINDINGS: Uterus

Measurements: 5.7 x 4.3 x 3.6 cm = volume: 46 mL. No fibroids or
other mass visualized.

Endometrium

Thickness: 5 mm in thickness.  No focal abnormality visualized.

Right ovary

Measurements: 1.9 x 1.2 x 1.2 cm = volume: 1.4 mL. Normal
appearance/no adnexal mass.

Left ovary

Measurements: 2.5 x 1.4 x 1.4 cm = volume: 2.6 mL. Normal
appearance/no adnexal mass.

Other findings

Small amount of free fluid in the left adnexa.
IMPRESSION: No acute findings or significant abnormality.
